# Patient Record
Sex: Female | Born: 2000 | Hispanic: Yes | Marital: Single | Smoking: Never smoker
Health system: Southern US, Community
[De-identification: ages and names within clinical notes are randomized; demographics above are authoritative.]

## PROBLEM LIST (undated history)

## (undated) DIAGNOSIS — F99 Mental disorder, not otherwise specified: Secondary | ICD-10-CM

## (undated) HISTORY — DX: Mental disorder, not otherwise specified: F99

---
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
## 2018-08-22 ENCOUNTER — Other Ambulatory Visit: Payer: Self-pay

## 2018-08-22 DIAGNOSIS — Z20822 Contact with and (suspected) exposure to covid-19: Secondary | ICD-10-CM

## 2018-08-25 LAB — NOVEL CORONAVIRUS, NAA: SARS-CoV-2, NAA: NOT DETECTED

## 2018-08-28 ENCOUNTER — Ambulatory Visit: Payer: Self-pay

## 2018-09-06 ENCOUNTER — Ambulatory Visit: Payer: Self-pay

## 2019-03-07 ENCOUNTER — Ambulatory Visit: Payer: Self-pay

## 2019-08-26 ENCOUNTER — Ambulatory Visit: Payer: Self-pay

## 2019-11-08 ENCOUNTER — Ambulatory Visit: Payer: Self-pay | Admitting: Family Medicine

## 2019-11-08 ENCOUNTER — Other Ambulatory Visit: Payer: Self-pay

## 2019-11-08 ENCOUNTER — Ambulatory Visit: Payer: Self-pay

## 2019-11-08 ENCOUNTER — Encounter: Payer: Self-pay | Admitting: Family Medicine

## 2019-11-08 VITALS — BP 120/82 | HR 86 | Ht 66.0 in | Wt 240.6 lb

## 2019-11-08 DIAGNOSIS — Z3009 Encounter for other general counseling and advice on contraception: Secondary | ICD-10-CM

## 2019-11-08 DIAGNOSIS — Z01419 Encounter for gynecological examination (general) (routine) without abnormal findings: Secondary | ICD-10-CM

## 2019-11-08 DIAGNOSIS — Z113 Encounter for screening for infections with a predominantly sexual mode of transmission: Secondary | ICD-10-CM

## 2019-11-08 DIAGNOSIS — Z30013 Encounter for initial prescription of injectable contraceptive: Secondary | ICD-10-CM

## 2019-11-08 DIAGNOSIS — Z1331 Encounter for screening for depression: Secondary | ICD-10-CM

## 2019-11-08 DIAGNOSIS — L732 Hidradenitis suppurativa: Secondary | ICD-10-CM

## 2019-11-08 LAB — PREGNANCY, URINE: Preg Test, Ur: NEGATIVE

## 2019-11-08 LAB — WET PREP FOR TRICH, YEAST, CLUE
Trichomonas Exam: NEGATIVE
Yeast Exam: NEGATIVE

## 2019-11-08 MED ORDER — MEDROXYPROGESTERONE ACETATE 150 MG/ML IM SUSP
150.0000 mg | INTRAMUSCULAR | Status: AC
  Administered 2019-11-08 – 2020-07-03 (×4): 150 mg via INTRAMUSCULAR

## 2019-11-08 MED ORDER — ULIPRISTAL ACETATE 30 MG PO TABS: 1.0000 | ORAL_TABLET | Freq: Once | ORAL | 0 refills | Status: AC

## 2019-11-08 NOTE — Progress Notes (Signed)
Presents for physical, depo, PT and requesting ECP. Results reviewed with provider, no treatment indicated per standing order. Ella dispensed to patient, consents signed and information sheet provider. Depo administered, date to return for next Depo given. Sharlyne Pacas, RN

## 2019-11-08 NOTE — Progress Notes (Signed)
[facility] [address] [place], [place] [address] Main Number: [phone]  Family Planning Visit- Initial Visit  Subjective:  Sara Gay is a 19 y.o.  [id number]  being seen today for an initial well woman visit and to discuss family planning options. Patient reports they do not want a pregnancy in the next year.   Chief Complaint  Patient presents with  . Annual Exam  . Contraception    Depo    Pt does not have a problem list on file.   HPI  Patient reports here for physical exam and to restart depo. She had unprotected sex 5 days ago, requests ECP today.  Pt denies all of the following, which are contraindications to Depo use: Known breast cancer Pregnancy Also denies: Hypertension (CDC cat 2 if mild, cat 3 if severe) Severe cirrhosis, hepatocellular adenoma Diabetes with nephrosis or vascular complications Ischemic heart disease or multiple risk factors for atherosclerotic disease, and some forms of lupus Unexplained vaginal bleeding Pregnancy planned within the next year Long-term use of corticosteroid therapy in women with a history of, or risk factors for, nontraumatic (frailty) fractures.  Current use of aminoglutethimide (usually for the treatment of Cushing's syndrome) because aminoglutethimide may increase metabolism of progestins    Patient's last menstrual period was 10/15/2019 (exact date). Last sex: 11/03/19 (5 days ago) BCM: none Pt desires EC? yes  Last pap per pt/review of record: never d/t age Last HIV test per pt/review of record: 04/2017 Last tetanus vaccine: probably w/in last 10 yrs Covid vaccine: had completed series  Last breast exam: never Personal/family hx breast cancer? no  Patient reports 2 partner(s) in last year. Do they desire STI screening (if no, why not)? yes  Does the patient desire a pregnancy in the next year? no   19 y.o., Body mass index is 38.83 kg/m. - Is  patient eligible for HA1C diabetes screening based on BMI and age >71?  no  HCV screening;       Has patient been screened once for HCV in the past?  no  No results found for: HCVAB      Does the patient have current drug use, have a partner with drug use, and/or has been incarcerated since last result? no If yes-- Screen for HCV through [facility]   Does the patient meet criteria for HBV testing? no Criteria:  -Household, sexual or needle sharing contact with HBV -History of drug use -HIV positive -Those with known Hep C  PHQ-9 score 6.   See flowsheet for other program required questions.   Health Maintenance Due  Topic Date Due  . Hepatitis C Screening  Never done  . COVID-19 Vaccine (1) Never done  . CHLAMYDIA SCREENING  Never done  . INFLUENZA VACCINE  Never done    ROS 10 point review of systems is otherwise negative except as mentioned in HPI and listed below: Dizziness/faint: on Tues w/teeth removal Blurry vision: on Tues w/teeth removal, none since Wt loss/gain: oscillates wt loss/gain HA: on/off takes tylenol w/relief Nausea; denies this upon questioning  The following portions of the patient's history were reviewed and updated as appropriate: allergies, current medications, past family history, past medical history, past social history, past surgical history and problem list. Problem list updated.   See flowsheet for other program required questions.  Objective:   Vitals:   11/08/19 1514  BP: 120/82  Pulse: 86  Weight: 240 lb 9.6 oz (109.1 kg)  Height: 5\' 6"  (1.676 m)    Physical Exam Vitals and nursing note reviewed.  Constitutional:      Appearance: Normal appearance.  HENT:     Head: Normocephalic and atraumatic.     Mouth/Throat:     Mouth: Mucous membranes are moist.     Pharynx: Oropharynx is clear. No oropharyngeal exudate or posterior oropharyngeal erythema.  Eyes:     Conjunctiva/sclera: Conjunctivae normal.  Neck:     Thyroid: No  thyroid mass, thyromegaly or thyroid tenderness.  Cardiovascular:     Rate and Rhythm: Normal rate and regular rhythm.     Pulses: Normal pulses.     Heart sounds: Normal heart sounds.  Pulmonary:     Effort: Pulmonary effort is normal.     Breath sounds: Normal breath sounds.  Chest:     Breasts:        Right: Normal. No swelling, mass, nipple discharge, skin change or tenderness.        Left: Normal. No swelling, mass, nipple discharge, skin change or tenderness.  Abdominal:     General: Abdomen is flat.     Palpations: There is no mass.     Tenderness: There is no abdominal tenderness. There is no rebound.  Genitourinary:    General: Normal vulva.     Exam position: Lithotomy position.     Pubic Area: No rash or pubic lice. +multiple small hypertrophic and atrophic scars along groin/inner thighs    Labia:        Right: No rash or lesion.        Left: No rash or lesion.      Vagina: Normal. No vaginal erythema, bleeding or lesions. Vaginal discharge: white, ph<4.5    Cervix: No cervical motion tenderness, discharge, friability, lesion or erythema.     Uterus: Normal.      Adnexa: Right adnexa normal and left adnexa normal.     Rectum: Normal.  Lymphadenopathy:     Head:     Right side of head: No preauricular or posterior auricular adenopathy.     Left side of head: No preauricular or posterior auricular adenopathy.     Cervical: No cervical adenopathy.     Upper Body:     Right upper body: No supraclavicular or axillary adenopathy.     Left upper body: No supraclavicular or axillary adenopathy.     Lower Body: No right inguinal adenopathy. No left inguinal adenopathy.  Skin:    General: Skin is warm and dry.     Findings: No rash.  Neurological:     Mental Status: She is alert and oriented to person, place, and time.      Assessment and Plan:  [name] is a 19 y.o. female presenting to the [facility] Department for an initial well woman  exam/family planning visit.  Contraception counseling: Reviewed all forms of birth control options in the tiered based approach. available including abstinence; over the counter/barrier methods; hormonal contraceptive medication including pill, patch, ring, injection,contraceptive implant, ECP; hormonal and nonhormonal IUDs; permanent sterilization options including vasectomy and the various tubal sterilization modalities. Risks, benefits, and typical effectiveness rates were reviewed.  Questions were answered.  Written information was also given to the patient to review.  Patient desires depo, this was prescribed for patient. She will follow up in  3 months for surveillance.  She was told to call with any further questions, or with any concerns about this method of contraception.  Emphasized use  of condoms 100% of the time for STI prevention.  Emergency Contraception: Pt was offered ECP. ECP was accepted by pt. ECP counseling given - see RN documentation.    1. Well woman exam -BCM: -Pt would like both Depo and emergency contraception today. As pt's BMI is >25 ulipristal acetate would be most effective EC, but this works best when given 5 (or more) days before depo. As pt states they cannot return at a later date for depo, they elect to take the chance of decreased efficacy of the emergency contraception and get both ulipristal acetate and depo today. I advised pt to take a home pregnancy test in 9 days and to use condoms x2 weeks.   -Pap: due age 39 -CBE: done today. "Active FYIs" info up to date. Recommended screening mammograms beginning at age 72 -Hepatitis B/C screening: qualifies for hep c, accepts - Pregnancy, urine - medroxyPROGESTERone (DEPO-PROVERA) injection 150 mg - ulipristal acetate (ELLA) 30 MG tablet; Take 1 tablet (30 mg total) by mouth once for 1 dose.  Dispense: 1 tablet; Refill: 0  2. Screening examination for venereal disease -Pt without symptoms. Screenings today as below.  Treat wet prep per standing order. -Patient does meet criteria for HepC Screening. accepts these screenings. -Counseled on warning s/sx and when to seek care. Recommended condom use with all sex and discussed importance of condom use for STI prevention. - WET PREP FOR TRICH, YEAST, CLUE - Chlamydia/Gonorrhea [place] [facility] - HIV/HCV [place] [facility] - Syphilis Serology, [place] [facility]  3. Hidradenitis suppurativa -Recommended PCP f/u for referral/tx. Handout given today  4. Positive depression screening -PHQ-9 score is 6. Pt requests referral to AM, provided today. - Ambulatory referral to Behavioral Health     Return in about 3 months (around 02/08/2020) for Depo.  No future appointments.  Ann Held, PA-C

## 2019-11-09 ENCOUNTER — Encounter: Payer: Self-pay | Admitting: Family Medicine

## 2019-11-19 ENCOUNTER — Encounter: Payer: Self-pay | Admitting: Family Medicine

## 2019-11-19 ENCOUNTER — Telehealth: Payer: Self-pay | Admitting: Family Medicine

## 2019-11-19 LAB — HM HIV SCREENING LAB: HM HIV Screening: NEGATIVE

## 2019-11-19 LAB — HM HEPATITIS C SCREENING LAB: HM Hepatitis Screen: NEGATIVE

## 2019-11-19 NOTE — Telephone Encounter (Signed)
Attempt 1: Call to patient to inform of positive chlamydia  infection.  No answer unable to [facility].   Wendi Snipes, RN

## 2019-11-26 NOTE — Telephone Encounter (Signed)
TC to patient. Verified ID via password/SS#. Informed of positive chlamydia and need for tx. Instructed to eat before visit and have partner call for tx appt. Appt scheduled.Tamiah Dysart, RN    

## 2019-11-26 NOTE — Telephone Encounter (Signed)
TC with patient via mom's #.  Asked patient her PW and she states she will call RN back. Richmond Campbell, RN   Needs tx for chlamydia.

## 2019-11-27 ENCOUNTER — Ambulatory Visit: Payer: Self-pay

## 2019-11-27 ENCOUNTER — Other Ambulatory Visit: Payer: Self-pay

## 2019-11-27 DIAGNOSIS — A749 Chlamydial infection, unspecified: Secondary | ICD-10-CM

## 2019-11-27 MED ORDER — AZITHROMYCIN 500 MG PO TABS
1000.0000 mg | ORAL_TABLET | Freq: Once | ORAL | Status: AC
  Administered 2019-11-27: 1000 mg via ORAL

## 2019-11-27 NOTE — Progress Notes (Addendum)
Treated for chlamydia today with Azithromycin per Standing Order Dr. Alvester Morin. Pt requests expedited partner treatment as her partner works from 6 am - 7 pm and no upcoming days off and unable to come for treatment. Pt reports partner has NKDA, no health problems, and no symptoms.  Consult with Beatris Si, PA who orders expedited partner treatment and signed out  Azithromycin in Log Book. RN dispensed to pt for partner: Azithromycin 500 mg x 2 tabs to be taken once by mouth. RN reviewed all forms and instructions per Expedited Partner treatment with pt. Reviewed instructions and steps to take if reaction/allergy. Spanish and English forms given as pt states partner speaks [place]. Pt speaks and reads English.  Questions answered and reports understanding. Jerel Shepherd, RN

## 2019-11-27 NOTE — Progress Notes (Signed)
Consulted by RN re:  Expedited partner therapy for patient.  Signed out medicine and gave to RN to give to patient for partner. Reviewed RN note and agree that it reflects our discussion and recommendations.

## 2019-12-11 ENCOUNTER — Ambulatory Visit (LOCAL_COMMUNITY_HEALTH_CENTER): Payer: Self-pay | Admitting: Physician Assistant

## 2019-12-11 ENCOUNTER — Encounter: Payer: Self-pay | Admitting: Physician Assistant

## 2019-12-11 ENCOUNTER — Other Ambulatory Visit: Payer: Self-pay

## 2019-12-11 VITALS — BP 133/81 | Ht 65.0 in | Wt 244.0 lb

## 2019-12-11 DIAGNOSIS — Z3009 Encounter for other general counseling and advice on contraception: Secondary | ICD-10-CM

## 2019-12-11 NOTE — Progress Notes (Signed)
Pt is here as she has been having constant menstrual bleeding since 11/30/2019 after she was treated for Chlamydia. Pt last received Depo 11/08/2019, so pt is 4 weeks and 5 days post her last Depo.

## 2019-12-12 NOTE — Progress Notes (Signed)
° °  WH problem visit  [facility]  Subjective:  Sara Gay is a 19 y.o. being seen today due to irregular bleeding.  Chief Complaint  Patient presents with   Contraception    concerns about menstraul bleeding    HPI Patient states that she has had light to moderate bleeding since 11/30/2019.  Denies abdominal pain/cramping, heavy bleeding and changes to bowel and bladder function.  Got her Depo on 11/08/2019.  Does the patient have a current or past history of drug use? No   No components found for: HCV]   Health Maintenance Due  Topic Date Due   COVID-19 Vaccine (1) Never done   CHLAMYDIA SCREENING  Never done   INFLUENZA VACCINE  Never done   PAP-Cervical Cytology Screening  11/22/2019   PAP SMEAR-Modifier  11/22/2019    Review of Systems  All other systems reviewed and are negative.   The following portions of the patient's history were reviewed and updated as appropriate: allergies, current medications, past family history, past medical history, past social history, past surgical history and problem list. Problem list updated.   See flowsheet for other program required questions.  Objective:   Vitals:   12/11/19 1617  BP: 133/81  Weight: 244 lb (110.7 kg)  Height: 5\' 5"  (1.651 m)    Physical Exam Constitutional:      General: She is not in acute distress.    Appearance: Normal appearance.  HENT:     Head: Normocephalic and atraumatic.  Eyes:     Conjunctiva/sclera: Conjunctivae normal.  Pulmonary:     Effort: Pulmonary effort is normal.  Skin:    General: Skin is warm and dry.  Neurological:     Mental Status: She is alert and oriented to person, place, and time.  Psychiatric:        Mood and Affect: Mood normal.        Behavior: Behavior normal.        Thought Content: Thought content normal.        Judgment: Judgment normal.       Assessment and Plan:  Sara Gay is  a 19 y.o. female presenting to the [facility] for a Women's Health problem visit  1. Encounter for counseling regarding contraception Counseled patient that it is normal to have irregular bleeding with Depo until after the third or fourth shot. Reassured that even though she did not have this happen when she was on Depo in the past, it could happen this time. Enc to try taking Ibuprofen 800 mg every 8 hr with food of milk for 5-7 days in a row to stop bleeding. Counseled that she should call back if this does not stop bleeding or bleeding worsens. RTC for next Depo in about 7 weeks and prn.     No follow-ups on file.  No future appointments.  [facility], PA

## 2020-01-27 ENCOUNTER — Ambulatory Visit (LOCAL_COMMUNITY_HEALTH_CENTER): Payer: Self-pay | Admitting: Physician Assistant

## 2020-01-27 ENCOUNTER — Other Ambulatory Visit: Payer: Self-pay

## 2020-01-27 ENCOUNTER — Ambulatory Visit: Payer: Self-pay

## 2020-01-27 VITALS — BP 128/86 | Ht 66.0 in | Wt 241.0 lb

## 2020-01-27 DIAGNOSIS — Z113 Encounter for screening for infections with a predominantly sexual mode of transmission: Secondary | ICD-10-CM

## 2020-01-27 DIAGNOSIS — Z01419 Encounter for gynecological examination (general) (routine) without abnormal findings: Secondary | ICD-10-CM

## 2020-01-27 DIAGNOSIS — Z3042 Encounter for surveillance of injectable contraceptive: Secondary | ICD-10-CM

## 2020-01-27 DIAGNOSIS — Z3009 Encounter for other general counseling and advice on contraception: Secondary | ICD-10-CM

## 2020-01-27 DIAGNOSIS — Z30013 Encounter for initial prescription of injectable contraceptive: Secondary | ICD-10-CM

## 2020-01-27 LAB — PREGNANCY, URINE: Preg Test, Ur: NEGATIVE

## 2020-01-27 NOTE — Progress Notes (Signed)
Patient in clinic today and states had +PT 2 weeks ago at home.  Offered STD screening as stated on the scheduled but patient isn't sure she needs testing.  "I am still with the same partner". On time for Depo today.  Sent to lab for PT Richmond Campbell, RN   +Chlamydia test in October 2021 Richmond Campbell, RN   PT negative today; co patient needs TOC Chlamydia and Depo in arm Richmond Campbell, RN   Depo given in let deltoid; tolerated well Richmond Campbell, RN

## 2020-01-28 NOTE — Progress Notes (Signed)
   [facility] problem [facility]  [facility]  Subjective:  Sara Gay is a 19 y.o. being seen today to get Depo.  Chief Complaint  Patient presents with  . Contraception    HPI  Patient states that she is due to get her Depo and to recheck her Chlamydia test.  Denies current symptoms and is concerned that she may be pregnant due to positive pregnancy test at home recently.    Does the patient have a current or past history of drug use? No   No components found for: HCV]   Health Maintenance Due  Topic Date Due  . COVID-19 Vaccine (1) Never done  . CHLAMYDIA SCREENING  Never done  . INFLUENZA VACCINE  Never done  . PAP-Cervical Cytology Screening  Never done  . PAP SMEAR-Modifier  Never done    ROS  The following portions of the patient's history were reviewed and updated as appropriate: allergies, current medications, past family history, past medical history, past social history, past surgical history and problem list. Problem list updated.   See flowsheet for other program required questions.  Objective:   Vitals:   01/27/20 1447  BP: 128/86  Weight: 241 lb (109.3 kg)  Height: 5\' 6"  (1.676 m)    Physical Exam Vitals and nursing note reviewed.  Constitutional:      General: She is not in acute distress.    Appearance: Normal appearance.  HENT:     Head: Normocephalic and atraumatic.  Eyes:     Conjunctiva/sclera: Conjunctivae normal.  Pulmonary:     Effort: Pulmonary effort is normal.  Skin:    General: Skin is warm and dry.  Neurological:     Mental Status: She is alert and oriented to person, place, and time.  Psychiatric:        Mood and Affect: Mood normal.        Behavior: Behavior normal.        Thought Content: Thought content normal.        Judgment: Judgment normal.       Assessment and Plan:  Sara Gay is a 19 y.o. female presenting to the [facility] Department for a  Women's Health problem visit  1. Family planning Counseled patient re: OTC pregnancy tests and that sometimes if you wait too long to read them they can change from negative to positive. Reassured patient that pregnancy test today is negative. Enc condoms with all sex for STD protection. - Pregnancy, urine  2. Screening for STD (sexually transmitted disease) Patient requests to re-screen today due to recent positive Chlamydia test and treatment. Reviewed with patient how to collect sample for accurate results. Await test results.  Counseled that RN will call if needs to RTC for treatment once results are back. - Chlamydia/Gonorrhea [place] [facility]  3. Surveillance for Depo-Provera contraception OK to continue with Depo 150 mg IM per previous order.     No follow-ups on file.  No future appointments.  [facility], PA

## 2020-04-17 ENCOUNTER — Ambulatory Visit (LOCAL_COMMUNITY_HEALTH_CENTER): Payer: Self-pay

## 2020-04-17 ENCOUNTER — Other Ambulatory Visit: Payer: Self-pay

## 2020-04-17 VITALS — BP 113/78 | Ht 66.0 in | Wt 240.5 lb

## 2020-04-17 DIAGNOSIS — Z3009 Encounter for other general counseling and advice on contraception: Secondary | ICD-10-CM

## 2020-04-17 DIAGNOSIS — Z3042 Encounter for surveillance of injectable contraceptive: Secondary | ICD-10-CM

## 2020-04-17 NOTE — Progress Notes (Signed)
11 weeks 4 days post depo. Depo given today R Deltoid per order by Maximiano Coss, PA-C dated 11/08/2019. Tolerated well. Depo consent signed today. Pt speaks and reads English, declines interpreter. Next depo due 07/03/2020, pt aware. Jerel Shepherd, RN

## 2020-07-03 ENCOUNTER — Other Ambulatory Visit: Payer: Self-pay

## 2020-07-03 ENCOUNTER — Ambulatory Visit (LOCAL_COMMUNITY_HEALTH_CENTER): Payer: Self-pay

## 2020-07-03 VITALS — BP 121/77 | Ht 66.0 in | Wt 240.0 lb

## 2020-07-03 DIAGNOSIS — Z3009 Encounter for other general counseling and advice on contraception: Secondary | ICD-10-CM

## 2020-07-03 DIAGNOSIS — Z3042 Encounter for surveillance of injectable contraceptive: Secondary | ICD-10-CM

## 2020-07-03 DIAGNOSIS — Z01419 Encounter for gynecological examination (general) (routine) without abnormal findings: Secondary | ICD-10-CM

## 2020-07-03 NOTE — Progress Notes (Signed)
11 weeks post depo. Depo given per order by Maximiano Coss, PA-C dated 11/08/2019. Tolerated well L delt. Next depo due 09/18/2020, pt aware. Jerel Shepherd, RN

## 2020-10-08 ENCOUNTER — Ambulatory Visit: Payer: Self-pay

## 2020-11-11 ENCOUNTER — Ambulatory Visit: Payer: Self-pay

## 2021-05-21 ENCOUNTER — Ambulatory Visit: Payer: Self-pay

## 2022-02-03 ENCOUNTER — Ambulatory Visit: Payer: Self-pay

## 2022-05-10 ENCOUNTER — Ambulatory Visit: Payer: Self-pay

## 2022-08-19 ENCOUNTER — Ambulatory Visit: Payer: Self-pay

## 2022-09-16 ENCOUNTER — Encounter: Payer: Self-pay | Admitting: Family Medicine

## 2022-09-16 ENCOUNTER — Ambulatory Visit (LOCAL_COMMUNITY_HEALTH_CENTER): Payer: Self-pay | Admitting: Family Medicine

## 2022-09-16 VITALS — BP 117/77 | HR 71 | Ht 66.0 in | Wt 247.0 lb

## 2022-09-16 DIAGNOSIS — Z3009 Encounter for other general counseling and advice on contraception: Secondary | ICD-10-CM

## 2022-09-16 DIAGNOSIS — Z01419 Encounter for gynecological examination (general) (routine) without abnormal findings: Secondary | ICD-10-CM

## 2022-09-16 DIAGNOSIS — Z113 Encounter for screening for infections with a predominantly sexual mode of transmission: Secondary | ICD-10-CM

## 2022-09-16 LAB — HM HIV SCREENING LAB: HM HIV Screening: NEGATIVE

## 2022-09-16 LAB — WET PREP FOR TRICH, YEAST, CLUE
Trichomonas Exam: NEGATIVE
Yeast Exam: NEGATIVE

## 2022-09-16 MED ORDER — NORGESTIM-ETH ESTRAD TRIPHASIC 0.18/0.215/0.25 MG-25 MCG PO TABS: 1.0000 | ORAL_TABLET | Freq: Every day | ORAL | Status: DC

## 2022-09-16 NOTE — Progress Notes (Signed)
 Pt is here for family planning visit.  Family planning packet reviewed and given to pt.  Wet prep results reviewed, no treatment required per standing orders. Condoms given.  Gaspar Garbe, RN

## 2022-09-16 NOTE — Progress Notes (Signed)
The patient was dispensed tri lo sprintec x6 packs today. I provided counseling today regarding the medication. We discussed the medication, the side effects and when to call clinic. Patient given the opportunity to ask questions. Questions answered.   Gaspar Garbe, RN

## 2022-09-16 NOTE — Progress Notes (Signed)
[facility] DEPARTMENT [facility] [address] Main Number: [phone]  Family Planning Visit- Repeat Yearly Visit  Subjective:  Ginny Gostomski is a 22 y.o. G0P0000  being seen today for an annual wellness visit and to discuss contraception options.   The patient is currently using No Method - No Contraceptive Precautions for pregnancy prevention. Patient does not want a pregnancy in the next year.    report they are looking for a method that provides High efficacy at preventing pregnancy   Patient has the following medical problems: does not have a problem list on file.  Chief Complaint  Patient presents with   Annual Exam    Patient reports to clinic for PE and pap smear and for birth control   Patient denies concerns about self   See flowsheet for other program required questions.   Body mass index is 39.87 kg/m. - Patient is eligible for diabetes screening based on BMI> 25 and age >35?  no HA1C ordered? not applicable  Patient reports 4 of partners in last year. Desires STI screening?  Yes   Has patient been screened once for HCV in the past?  No  No results found for: "HCVAB"  Does the patient have current of drug use, have a partner with drug use, and/or has been incarcerated since last result? No  If yes-- Screen for HCV through [facility]   Does the patient meet criteria for HBV testing? No  Criteria:  -Household, sexual or needle sharing contact with HBV -History of drug use -HIV positive -Those with known Hep C   Health Maintenance Due  Topic Date Due   CHLAMYDIA SCREENING  Never done   PAP-Cervical Cytology Screening  Never done   PAP SMEAR-Modifier  Never done   COVID-19 Vaccine (1 - 2023-24 season) Never done   INFLUENZA VACCINE  09/08/2022    Review of Systems  Constitutional:  Negative for weight loss.  Eyes:  Negative for blurred vision.  Respiratory:  Negative for cough and shortness of  breath.   Cardiovascular:  Negative for claudication.  Gastrointestinal:  Negative for nausea.  Genitourinary:  Negative for dysuria and frequency.  Skin:  Negative for rash.  Neurological:  Positive for headaches.  Endo/Heme/Allergies:  Does not bruise/bleed easily.    The following portions of the patient's history were reviewed and updated as appropriate: allergies, current medications, past family history, past medical history, past social history, past surgical history and problem list. Problem list updated.  Objective:   Vitals:   09/16/22 0854  BP: 117/77  Pulse: 71  Weight: 247 lb (112 kg)  Height: 5\' 6"  (1.676 m)    Physical Exam Vitals and nursing note reviewed.  Constitutional:      Appearance: Normal appearance.  HENT:     Head: Normocephalic and atraumatic.     Mouth/Throat:     Mouth: Mucous membranes are moist.     Pharynx: Oropharynx is clear. No oropharyngeal exudate or posterior oropharyngeal erythema.  Pulmonary:     Effort: Pulmonary effort is normal.  Abdominal:     General: Abdomen is flat.     Palpations: There is no mass.     Tenderness: There is no abdominal tenderness. There is no rebound.  Genitourinary:    General: Normal vulva.     Exam position: Lithotomy position.     Pubic Area: No rash or pubic lice.      Labia:  Right: No rash or lesion.        Left: No rash or lesion.      Vagina: Vaginal discharge present. No erythema, bleeding or lesions.     Cervix: Friability present. No cervical motion tenderness, discharge, lesion or erythema.     Uterus: Normal.      Adnexa: Right adnexa normal and left adnexa normal.     Rectum: Normal.     Comments: pH = 4  White discharge present Lymphadenopathy:     Head:     Right side of head: No preauricular or posterior auricular adenopathy.     Left side of head: No preauricular or posterior auricular adenopathy.     Cervical: No cervical adenopathy.     Upper Body:     Right upper body:  No supraclavicular, axillary or epitrochlear adenopathy.     Left upper body: No supraclavicular, axillary or epitrochlear adenopathy.     Lower Body: No right inguinal adenopathy. No left inguinal adenopathy.  Skin:    General: Skin is warm and dry.     Findings: No rash.  Neurological:     Mental Status: She is alert and oriented to person, place, and time.       Assessment and Plan:  Michaiah Muzzi is a 22 y.o. female G0P0000 presenting to the [facility] Department for an yearly wellness and contraception visit  1. Well woman exam with routine gynecological exam -CBE not indicated until 25 per ACOG guidelines -no concerns about self -reports 1 HA a month when her braces are tightened   - IGP, rfx Aptima HPV ASCU  2. Family planning Contraception counseling: Reviewed options based on patient desire and reproductive life plan. Patient is interested in Oral Contraceptive. This was provided to the patient today.   Risks, benefits, and typical effectiveness rates were reviewed.  Questions were answered.  Written information was also given to the patient to review.    The patient will follow up in  1 years for surveillance.  The patient was told to call with any further questions, or with any concerns about this method of contraception.  Emphasized use of condoms 100% of the time for STI prevention.  Educated on ECP and assessed need for ECP. Patient was offered ECP based on > 120 hours .  ECP not indicated  - Norgestimate-Ethinyl Estradiol Triphasic (TRI-LO-SPRINTEC) 0.18/0.215/0.25 MG-25 MCG tab; Take 1 tablet by mouth daily.  3. Screening for venereal disease -reports discharge with odor  - Chlamydia/Gonorrhea [place] [facility] - HIV [place] [facility] - Syphilis Serology, [place] [facility] - WET PREP FOR TRICH, YEAST, CLUE    Return in about 1 year (around 09/16/2023) for annual well-woman exam.  No future appointments.  Lenice Llamas, [place]

## 2022-09-26 ENCOUNTER — Telehealth: Payer: Self-pay

## 2022-09-26 ENCOUNTER — Ambulatory Visit: Payer: Self-pay

## 2022-09-26 DIAGNOSIS — A749 Chlamydial infection, unspecified: Secondary | ICD-10-CM

## 2022-09-26 MED ORDER — DOXYCYCLINE HYCLATE 100 MG PO TABS: 100.0000 mg | ORAL_TABLET | Freq: Two times a day (BID) | ORAL | Status: AC

## 2022-09-26 NOTE — Telephone Encounter (Signed)
Pt notified of positive Chlamydia results.  Treatment appointment made for today @ 3:00 pm.  Berdie Ogren, RN

## 2022-09-26 NOTE — Progress Notes (Addendum)
Pt is here for treatment of Chlamydia.  Pt is taking OCP's and LMP was 09/04/2022.   Pt states that she has not had any unprotected sex within the last two weeks.   The patient was dispensed Doxycycline 100mg   today. I provided counseling today regarding the medication. We discussed the medication, the side effects and when to call clinic. Patient given the opportunity to ask questions. Questions answered.  Chlamydia pamphlet, condoms and Doxycycline information sheet was given to pt.  Berdie Ogren, RN

## 2022-09-27 LAB — IGP, RFX APTIMA HPV ASCU

## 2023-03-01 ENCOUNTER — Ambulatory Visit: Payer: Self-pay

## 2023-03-02 ENCOUNTER — Encounter: Payer: Self-pay | Admitting: Physician Assistant

## 2023-03-02 ENCOUNTER — Ambulatory Visit: Payer: Self-pay | Admitting: Nurse Practitioner

## 2023-03-02 ENCOUNTER — Ambulatory Visit: Payer: Self-pay

## 2023-03-02 DIAGNOSIS — Z3169 Encounter for other general counseling and advice on procreation: Secondary | ICD-10-CM

## 2023-03-02 DIAGNOSIS — Z113 Encounter for screening for infections with a predominantly sexual mode of transmission: Secondary | ICD-10-CM

## 2023-03-02 LAB — HM HIV SCREENING LAB: HM HIV Screening: NEGATIVE

## 2023-03-02 LAB — WET PREP FOR TRICH, YEAST, CLUE
Trichomonas Exam: NEGATIVE
Yeast Exam: NEGATIVE

## 2023-03-02 LAB — HEPATITIS B SURFACE ANTIGEN: Hepatitis B Surface Ag: NONREACTIVE

## 2023-03-02 LAB — HM HEPATITIS C SCREENING LAB: HM Hepatitis Screen: NEGATIVE

## 2023-03-02 NOTE — Progress Notes (Signed)
[employer] HEALTH DEPARTMENT [facility] [address], [address] [place] [address] Main phone: [phone]  Family Planning Visit - Repeat Yearly Visit  Subjective:  Sara Gay is a 23 y.o. G0P0000  being seen today for an acute visit for STI testing and to discuss preconception care. The patient is currently using No Method - Other Reason for pregnancy prevention. Patient does want a pregnancy in the next year.   Patient reports they are looking for a method that provides Other wants to get pregnant  Patient has the following medical problems:  There are no active problems to display for this patient.   Chief Complaint  Patient presents with   STI Screen    HPI Patient reports 2 female partners in the past 2 months, practices vaginal and oral sex, does not use condoms, and has a history of chlamydia 5 months ago that was treated. Last sex 02/05/23. She was prescribed COC 5 months ago and took them for 2-3 months, but states they caused weight gain so she stopped taking them. She indicates now she is wanting to conceive.  LMP 02/16/23. Reports periods monthly.  Patient reports her female partner she has known for a while and have previously been broken up but have recently reconnected and are dating. She states he wants to be a father. Says that he lives in [place] and is coming here around Valentines when they will try to conceive. She indicates if she becomes pregnant she will move to [place]; however, she wishes to seek prenatal care here and deliver here because her mom is here in [place].  Review of Systems  Constitutional:  Negative for weight loss.  Eyes:  Negative for blurred vision.  Respiratory:  Negative for cough and shortness of breath.   Cardiovascular:  Negative for claudication.  Gastrointestinal:  Negative for nausea.  Genitourinary:  Negative for dysuria and frequency.  Skin:  Negative for rash.  Neurological:  Negative for headaches.   Endo/Heme/Allergies:  Does not bruise/bleed easily.   See flowsheet for other program required questions.   Diabetes screening This patient is 23 y.o. with a BMI of Body mass index is 39.83 kg/m.Marland Kitchen  Is patient eligible for diabetes screening (age >35 and BMI >25)?  no  Was Hgb A1c ordered? not applicable  STI screening Patient reports 2 of partners in last year.  Does this patient desire STI screening?  Yes  Hepatitis C screening Has patient been screened once for HCV in the past?  No  No results found for: "HCVAB"  Does the patient meet criteria for HCV testing? Yes  (If yes-- Screen for HCV through [facility]) Criteria:  Since the last HCV result, does the patient have any of the following? - Current drug use - Have a partner with drug use - Has been incarcerated  Hepatitis B screening Does the patient meet criteria for HBV testing? Yes Criteria:  -Household, sexual or needle sharing contact with HBV -History of drug use -HIV positive -Those with known Hep C  Last HIV test per patient/review of record was  Lab Results  Component Value Date   HMHIVSCREEN Negative - Validated 09/16/2022   No results found for: "HIV"   Last HEPC test per patient/review of record was  Lab Results  Component Value Date   HMHEPCSCREEN Negative-Validated 11/19/2019   No components found for: "HEPC"   Last HEPB test per patient/review of record was No components found for: "HMHEPBSCREEN" No components found  for: "HEPC"   Cervical Cancer Screening  Result Date Procedure Results Follow-ups  09/16/2022 IGP, rfx Aptima HPV ASCU DIAGNOSIS:: Comment Specimen adequacy:: Comment Clinician Provided ICD10: Comment Performed by:: Comment QC reviewed by:: Comment PAP Smear Comment: . Note:: Comment Test Methodology: CANCELED PAP Reflex: Comment    Screening for MPX risk: Does the patient have an unexplained rash? No Is the patient MSM? No Does the patient endorse multiple sex partners or  anonymous sex partners? Yes Did the patient have close or sexual contact with a person diagnosed with MPX? No Has the patient traveled outside the [place] where MPX is endemic? No Is there a high clinical suspicion for MPX-- evidenced by one of the following No  -Unlikely to be chickenpox  -Lymphadenopathy  -Rash that present in same phase of evolution on any given body part See flowsheet for further details and programmatic requirements.   Health Maintenance Due  Topic Date Due   CHLAMYDIA SCREENING  Never done   INFLUENZA VACCINE  Never done   COVID-19 Vaccine (1 - 2024-25 season) Never done    The following portions of the patient's history were reviewed and updated as appropriate: allergies, current medications, past family history, past medical history, past social history, past surgical history and problem list. Problem list updated.  Objective:   Vitals:   03/02/23 1118  BP: 114/71  Pulse: 68  Temp: 97.8 F (36.6 C)  Weight: 246 lb 12.8 oz (111.9 kg)    Physical Exam Nursing note reviewed.  Constitutional:      Appearance: Normal appearance. She is well-developed. She is obese.  HENT:     Head: Normocephalic.     Salivary Glands: Right salivary gland is not diffusely enlarged or tender. Left salivary gland is not diffusely enlarged or tender.     Mouth/Throat:     Lips: Pink. No lesions.     Mouth: Mucous membranes are moist.     Tongue: No lesions. Tongue does not deviate from midline.     Pharynx: Oropharynx is clear. Uvula midline. No oropharyngeal exudate or posterior oropharyngeal erythema.     Tonsils: No tonsillar exudate.  Eyes:     General:        Right eye: No discharge.        Left eye: No discharge.  Pulmonary:     Effort: Pulmonary effort is normal.  Abdominal:     Tenderness: There is no right CVA tenderness or left CVA tenderness.  Genitourinary:    Comments: Asymptomatic. Declined genital exam.  Lymphadenopathy:     Head:     Right side of head:  No submental, submandibular, tonsillar, preauricular or posterior auricular adenopathy.     Left side of head: No submental, submandibular, tonsillar, preauricular or posterior auricular adenopathy.     Cervical: No cervical adenopathy.     Right cervical: No superficial or posterior cervical adenopathy.    Left cervical: No superficial or posterior cervical adenopathy.     Upper Body:     Right upper body: No supraclavicular or axillary adenopathy.     Left upper body: No supraclavicular or axillary adenopathy.  Skin:    General: Skin is warm and dry.  Neurological:     Mental Status: She is alert and oriented to person, place, and time.  Psychiatric:        Attention and Perception: Attention and perception normal.        Mood and Affect: Mood and affect normal.  Speech: Speech normal.        Behavior: Behavior normal. Behavior is cooperative.        Thought Content: Thought content normal.     Assessment and Plan:  Sara Gay is a 23 y.o. female G0P0000 presenting to the [facility] Department for an yearly wellness and contraception visit.  1. Encounter for preconception consultation  -Patient not offered contraception as she is currently seeking pregnancy. She was given preconception counseling regarding starting PNV with folate (folic acid) daily. Explained to patient the importance of folic acid in preventing spinal deformities in babies and that it is critical to take this prior to becoming pregnant. She reports currently tracking her periods and she was encouraged to continue that.  -Encouraged patient that if she believes she has gotten pregnant to come back to clinic for a UPT and if positive we would provide prenatal vitamins. Also, educated that we do offer prenatal care at this clinic and encouraged her to set that up as soon as she finds out she is pregnant whether it be at this clinic or another OB office.   -No ECP given as patient is beyond  5 days unprotected sex and seeking pregnancy.   1. Screening for venereal disease (Primary)  - Chlamydia/Gonorrhea [place] [facility] - Syphilis Serology, [place] [facility] - HBV Antigen/Antibody [facility] - HIV/HCV [place] [facility] - Gonococcus culture - WET PREP FOR TRICH, YEAST, CLUE  Patient accepted all screenings including oral, vaginal CT/GC and bloodwork for HIV/RPR/HBV/HCV, and wet prep. Patient meets criteria for HepB screening? Yes. Ordered? yes Patient meets criteria for HepC screening? Yes. Ordered? yes   Treat wet prep per standing order. Discussed time line for State Lab results and that patient will be called with positive results and encouraged patient to call if she had not heard in 2 weeks.  Counseled to return or seek care for continued or worsening symptoms Recommended repeat testing in 3 months with positive results. Recommended condom use with all sex for STI prevention.   Wet prep reviewed today and negative. No treatment indicated at this time.  Advised pt additional STI testing will come back in about 3 weeks at which time she will receive a call from [place] if positive and will need to return for treatment at that time.   Return for STI screening as needed.  No future appointments.  Total time with patient 30 minutes.   Edmonia James, NP

## 2023-03-02 NOTE — Progress Notes (Deleted)
[facility] Department STI clinic [address] [place] [address] Main phone: [phone]  STI screening visit  Subjective:  Sara Gay is a 23 y.o. female being seen today for an STI screening visit. The patient reports they {Actions; do/do not:19616} have symptoms.  Patient reports that they {Actions; do/do not:19616} desire a pregnancy in the next year.   They reported they {Actions; are/are not:16769} interested in discussing contraception today.    Patient's last menstrual period was 02/16/2023 (exact date).  Patient has the following medical conditions:  There are no active problems to display for this patient.   Chief Complaint  Patient presents with   STI Screen    HPI HPI Patient reports ***  Does the patient using douching products? {yes/no:20286}  Last HIV test per patient/review of record was  Lab Results  Component Value Date   HMHIVSCREEN Negative - Validated 09/16/2022   No results found for: "HIV"   Last HEPC test per patient/review of record was  Lab Results  Component Value Date   HMHEPCSCREEN Negative-Validated 11/19/2019   No components found for: "HEPC"   Last HEPB test per patient/review of record was No components found for: "HMHEPBSCREEN" No components found for: "HEPC"   Patient reports last pap was: ***  No results found for: "DIAGPAP", "HPVHIGH", "ADEQPAP" Lab Results  Component Value Date   SPECADGYN Comment 09/16/2022   Result Date Procedure Results Follow-ups  09/16/2022 IGP, rfx Aptima HPV ASCU DIAGNOSIS:: Comment Specimen adequacy:: Comment Clinician Provided ICD10: Comment Performed by:: Comment QC reviewed by:: Comment PAP Smear Comment: . Note:: Comment Test Methodology: CANCELED PAP Reflex: Comment     Screening for MPX risk: Does the patient have an unexplained rash? {yes/no:20286} Is the patient MSM? {yes/no:20286} Does the patient endorse multiple sex partners or  anonymous sex partners? {yes/no:20286} Did the patient have close or sexual contact with a person diagnosed with MPX? {yes/no:20286} Has the patient traveled outside the [place] where MPX is endemic? {yes/no:20286} Is there a high clinical suspicion for MPX-- evidenced by one of the following {yes/no:20286}  -Unlikely to be chickenpox  -Lymphadenopathy  -Rash that present in same phase of evolution on any given body part See flowsheet for further details and programmatic requirements.   Immunization history:  Immunization History  Administered Date(s) Administered   HPV 9-valent 11/21/2013   Hepatitis A 11/16/2011, 11/21/2013   Hepatitis B 01/26/1999, 03/30/1999, 06/05/1999   Hpv-Unspecified 11/16/2011   MMR 11/25/1999, 11/24/2004   Meningococcal Mcv4o 11/16/2011   Td 12/04/2014   Tdap 06/28/2010   Varicella 07/01/2005, 11/16/2011     The following portions of the patient's history were reviewed and updated as appropriate: allergies, current medications, past medical history, past social history, past surgical history and problem list.  Objective:   Vitals:   03/02/23 1118  BP: 114/71  Pulse: 68  Temp: 97.8 F (36.6 C)  Weight: 246 lb 12.8 oz (111.9 kg)    Physical Exam  Assessment and Plan:  Sara Gay is a 24 y.o. female presenting to the [facility] Department for STI screening  1. Encounter for preconception consultation ***  2. Screening for venereal disease *** - Chlamydia/Gonorrhea [place] [facility] - Syphilis Serology, [place] [facility] - HBV Antigen/Antibody [facility] - HIV/HCV [place] [facility] - Gonococcus culture - WET PREP FOR TRICH, YEAST, CLUE   Patient accepted all screenings including ***oral, vaginal CT/GC and bloodwork for HIV/RPR, and wet prep. Patient meets criteria for HepB  screening? {yes/no:20286}. Ordered? {Response; yes/no/na:63} Patient meets criteria for HepC screening? {yes/no:20286}. Ordered? {Response; yes/no/na:63}  Treat  wet prep per standing order Discussed time line for State Lab results and that patient will be called with positive results and encouraged patient to call if she had not heard in 2 weeks.  Counseled to return or seek care for continued or worsening symptoms Recommended repeat testing in 3 months with positive results. Recommended condom use with all sex for STI prevention.   Patient is currently using {CCO Contraception:[id number]} to prevent pregnancy.    Return for STI screening as needed.  No future appointments.  Edmonia James, NP

## 2023-03-02 NOTE — Progress Notes (Signed)
In house lab results reviewed during visit. BTHIELE RN

## 2023-03-07 LAB — GONOCOCCUS CULTURE

## 2023-04-20 ENCOUNTER — Ambulatory Visit (LOCAL_COMMUNITY_HEALTH_CENTER): Payer: Self-pay | Admitting: Family Medicine

## 2023-04-20 ENCOUNTER — Encounter: Payer: Self-pay | Admitting: Family Medicine

## 2023-04-20 ENCOUNTER — Ambulatory Visit: Payer: Self-pay

## 2023-04-20 VITALS — HR 82 | Ht 65.0 in

## 2023-04-20 DIAGNOSIS — Z30011 Encounter for initial prescription of contraceptive pills: Secondary | ICD-10-CM

## 2023-04-20 DIAGNOSIS — Z113 Encounter for screening for infections with a predominantly sexual mode of transmission: Secondary | ICD-10-CM

## 2023-04-20 DIAGNOSIS — Z3009 Encounter for other general counseling and advice on contraception: Secondary | ICD-10-CM

## 2023-04-20 LAB — WET PREP FOR TRICH, YEAST, CLUE
Trichomonas Exam: NEGATIVE
Yeast Exam: NEGATIVE

## 2023-04-20 LAB — HM HIV SCREENING LAB: HM HIV Screening: NEGATIVE

## 2023-04-20 MED ORDER — NORGESTIM-ETH ESTRAD TRIPHASIC 0.18/0.215/0.25 MG-25 MCG PO TABS: 1.0000 | ORAL_TABLET | Freq: Every day | ORAL | Status: AC

## 2023-04-20 NOTE — Progress Notes (Signed)
   [facility] Problem Visit  [facility]  Subjective:  Sara Gay is a 23 y.o. being seen today for   Chief Complaint  Patient presents with   Annual Exam    PE/ Birth control. Depo?    HPI   Patient presents for more birth control and STI testing. Reports she was taking the tri-lo sprintec I had previously prescribed in August, however she then wanted to conceive and stopped the [facility]. She is unsure whether she wants to have a child. Patient states she likes the tri-lo sprintec because her weight was going down- while with depo it was increasing. Desires STI testing today- no symptoms   Health Maintenance Due  Topic Date Due   CHLAMYDIA SCREENING  Never done   INFLUENZA VACCINE  Never done   COVID-19 Vaccine (1 - 2024-25 season) Never done    ROS  The following portions of the patient's history were reviewed and updated as appropriate: allergies, current medications, past family history, past medical history, past social history, past surgical history and problem list. Problem list updated.   See flowsheet for other program required questions.  Objective:   Vitals:   04/20/23 0837  Pulse: 82  Height: 5\' 5"  (1.651 m)    Physical Exam  Deferred- self swabbed today. PE due in August  Assessment and Plan:  Sara Gay is a 23 y.o. female presenting to the [facility] Department for a Women's Health problem visit  1. Family planning (Primary)  - Norgestim-Eth Estrad Triphasic (NORGESTIMATE-ETHINYL ESTRADIOL TRIPHASIC) 0.18/0.215/0.25 MG-25 MCG tab; Take 1 tablet by mouth daily.  2. Screening for venereal disease  - Chlamydia/Gonorrhea [place] [facility] - HIV [place] [facility] - Syphilis Serology, [place] [facility] - WET PREP FOR TRICH, YEAST, CLUE   Return in about 6 months (around 10/21/2023) for annual well-woman exam.  No future appointments.  Lenice Llamas, [place]

## 2023-04-20 NOTE — Progress Notes (Signed)
 Wet prep results reviewed, no treatment required per standing orders. Condoms given. The patient was dispensed tri lo sprintec #6 today. I provided counseling today regarding the medication. We discussed the medication, the side effects and when to call clinic. Patient given the opportunity to ask questions. Questions answered.   Gaspar Garbe, RN

## 2023-04-24 ENCOUNTER — Ambulatory Visit (LOCAL_COMMUNITY_HEALTH_CENTER): Payer: Self-pay

## 2023-04-24 DIAGNOSIS — Z111 Encounter for screening for respiratory tuberculosis: Secondary | ICD-10-CM

## 2023-04-26 ENCOUNTER — Ambulatory Visit: Payer: Self-pay

## 2023-04-26 DIAGNOSIS — Z111 Encounter for screening for respiratory tuberculosis: Secondary | ICD-10-CM

## 2023-04-26 LAB — TB SKIN TEST
Induration: 0 mm
TB Skin Test: NEGATIVE

## 2023-04-27 ENCOUNTER — Other Ambulatory Visit: Payer: Self-pay

## 2023-05-25 ENCOUNTER — Encounter: Payer: Self-pay | Admitting: Nurse Practitioner

## 2023-05-25 ENCOUNTER — Ambulatory Visit: Payer: Self-pay | Admitting: Nurse Practitioner

## 2023-05-25 VITALS — BP 123/79 | HR 87 | Ht 64.0 in | Wt 239.0 lb

## 2023-05-25 DIAGNOSIS — Z30013 Encounter for initial prescription of injectable contraceptive: Secondary | ICD-10-CM

## 2023-05-25 DIAGNOSIS — Z3009 Encounter for other general counseling and advice on contraception: Secondary | ICD-10-CM

## 2023-05-25 DIAGNOSIS — Z113 Encounter for screening for infections with a predominantly sexual mode of transmission: Secondary | ICD-10-CM

## 2023-05-25 LAB — WET PREP FOR TRICH, YEAST, CLUE
Trichomonas Exam: NEGATIVE
Yeast Exam: NEGATIVE

## 2023-05-25 LAB — HM HIV SCREENING LAB: HM HIV Screening: NEGATIVE

## 2023-05-25 MED ORDER — MEDROXYPROGESTERONE ACETATE 150 MG/ML IM SUSP
150.0000 mg | INTRAMUSCULAR | Status: AC
  Administered 2023-05-25: 150 mg via INTRAMUSCULAR

## 2023-05-25 NOTE — Progress Notes (Addendum)
 [facility] Problem Visit  [facility]  Subjective:  Sara Gay is a 23 y.o. being seen today to change birth control methods and for STI screening.  Chief Complaint  Patient presents with   Contraception    Pt is here for [facility]  change     Pt presents today to switch from Sprintec to DMPA. She hasn't liked that her period changed color to brown and she feels that the pill has negatively affected her mood. She has been on DMPA in the past and liked it although she does note that she gained weight on the shot. She reports that she takes the pill daily but missed a pill on 4/9 and doubled up on 4/10. She took an at home pregnancy test this morning that was negative.        Does the patient have a current or past history of drug use? No   No components found for: "HCV"]   Health Maintenance Due  Topic Date Due   CHLAMYDIA SCREENING  Never done   COVID-19 Vaccine (1 - 2024-25 season) Never done    ROS  The following portions of the patient's history were reviewed and updated as appropriate: allergies, current medications, past family history, past medical history, past social history, past surgical history and problem list. Problem list updated.   See flowsheet for other program required questions.  Objective:   Vitals:   05/25/23 0957  BP: 123/79  Pulse: 87  Weight: 239 lb (108.4 kg)  Height: 5\' 4"  (1.626 m)    Physical Exam Vitals and nursing note reviewed.  Constitutional:      Appearance: Normal appearance.  HENT:     Head: Normocephalic.     Mouth/Throat:     Mouth: Mucous membranes are moist.  Cardiovascular:     Rate and Rhythm: Normal rate.  Pulmonary:     Effort: Pulmonary effort is normal.  Abdominal:     Palpations: Abdomen is soft.  Genitourinary:    Comments: Declined genital exam- no symptoms, self swabbed Musculoskeletal:        General: Normal range of motion.  Lymphadenopathy:     Head:      Right side of head: No submandibular, preauricular or posterior auricular adenopathy.     Left side of head: No submandibular, preauricular or posterior auricular adenopathy.     Cervical: No cervical adenopathy.     Upper Body:     Right upper body: No supraclavicular or axillary adenopathy.     Left upper body: No supraclavicular or axillary adenopathy.  Skin:    General: Skin is warm and dry.  Neurological:     Mental Status: She is alert and oriented to person, place, and time.  Psychiatric:        Mood and Affect: Mood normal.        Behavior: Behavior normal.      Assessment and Plan:  Sara Gay is a 23 y.o. female presenting to the [facility] Department for a Women's Health problem visit  1. Family planning (Primary) Reviewed DMPA risks/benefits, come in every 3 months for the shot, start taking multivitamin w Vit D and calcium and weightbearing for bone health, side effect of weight gain  Discussed that change in menstrual color is normal and chance of pregnancy is low, but if she does have a positive test to return to the health dept ASAP  Pt verbalized understanding  - medroxyPROGESTERone (DEPO-PROVERA)  injection 150 mg  2. Screening for venereal disease Pt opts to self swab Will call with any positive results in 2-3 weeks Not a candidate for hep b/c testing  - WET PREP FOR TRICH, YEAST, CLUE - Syphilis Serology, [place] [facility] - HIV [place] [facility] - Chlamydia/Gonorrhea [place] [facility]    Return in about 3 months (around 08/24/2023) for DMPA (11-13 weeks).  No future appointments.  Sara Demonte K Isley Zinni, NP

## 2023-05-25 NOTE — Progress Notes (Signed)
 Patient is here for an acute visit and contraception. Depo injection given at the [facility] and pt tolerated well to injection. Wet prep results reviewed with pt, no treatment required per standing order. Condoms declined. Austine Lefort, RN.

## 2023-08-10 ENCOUNTER — Ambulatory Visit: Payer: Self-pay

## 2023-12-18 ENCOUNTER — Ambulatory Visit: Payer: Self-pay

## 2024-01-26 ENCOUNTER — Telehealth: Payer: Self-pay | Admitting: Family Medicine

## 2024-01-26 NOTE — Telephone Encounter (Signed)
 Pt has been called back to address her concerns
